# Patient Record
Sex: Male | Born: 1970 | Race: Black or African American | Hispanic: No | Marital: Single | Smoking: Former smoker
Health system: Southern US, Community
[De-identification: ages and names within clinical notes are randomized; demographics above are authoritative.]

## PROBLEM LIST (undated history)

## (undated) DIAGNOSIS — M545 Low back pain, unspecified: Secondary | ICD-10-CM

## (undated) DIAGNOSIS — A64 Unspecified sexually transmitted disease: Secondary | ICD-10-CM

## (undated) DIAGNOSIS — N489 Disorder of penis, unspecified: Secondary | ICD-10-CM

## (undated) DIAGNOSIS — N529 Male erectile dysfunction, unspecified: Secondary | ICD-10-CM

## (undated) DIAGNOSIS — S21239A Puncture wound without foreign body of unspecified back wall of thorax without penetration into thoracic cavity, initial encounter: Secondary | ICD-10-CM

## (undated) DIAGNOSIS — W3400XA Accidental discharge from unspecified firearms or gun, initial encounter: Secondary | ICD-10-CM

---

## 2011-06-25 ENCOUNTER — Ambulatory Visit: Payer: Self-pay

## 2011-06-25 LAB — URINALYSIS, COMPLETE
Bacteria: NEGATIVE
Bilirubin,UR: NEGATIVE
Glucose,UR: NEGATIVE mg/dL (ref 0–75)
Ketone: NEGATIVE
Nitrite: NEGATIVE
Specific Gravity: >=1.03 (ref 1.003–1.030)
Squamous Epithelial: NONE SEEN

## 2011-06-25 LAB — DOT URINE DIP: Glucose,UR: NEGATIVE mg/dL (ref 0–75)

## 2011-06-26 LAB — URINE CULTURE

## 2013-09-06 ENCOUNTER — Ambulatory Visit: Payer: Self-pay | Admitting: Internal Medicine

## 2013-09-06 LAB — DOT URINE DIP
BLOOD: NEGATIVE
GLUCOSE, UR: NEGATIVE mg/dL (ref 0–75)
PROTEIN: NEGATIVE
Specific Gravity: 1.015 (ref 1.003–1.030)

## 2014-09-11 DIAGNOSIS — M545 Low back pain, unspecified: Secondary | ICD-10-CM | POA: Insufficient documentation

## 2014-09-11 DIAGNOSIS — N529 Male erectile dysfunction, unspecified: Secondary | ICD-10-CM | POA: Insufficient documentation

## 2014-09-11 DIAGNOSIS — W3301XA Accidental discharge of shotgun, initial encounter: Secondary | ICD-10-CM | POA: Insufficient documentation

## 2015-10-09 ENCOUNTER — Ambulatory Visit
Admission: EM | Admit: 2015-10-09 | Discharge: 2015-10-09 | Disposition: A | Discharge status: Home / Self Care | Payer: Self-pay | Attending: Family Medicine | Admitting: Family Medicine

## 2015-10-09 DIAGNOSIS — Z029 Encounter for administrative examinations, unspecified: Secondary | ICD-10-CM

## 2015-10-09 DIAGNOSIS — Z024 Encounter for examination for driving license: Secondary | ICD-10-CM

## 2015-10-09 LAB — DEPT OF TRANSP DIPSTICK, URINE (ARMC ONLY)
GLUCOSE, UA: NEGATIVE mg/dL
Hgb urine dipstick: NEGATIVE
Protein, ur: NEGATIVE mg/dL
Specific Gravity, Urine: 1.02 (ref 1.005–1.030)

## 2015-10-09 NOTE — Discharge Instructions (Signed)
Encouraged to quit smoking. Follow-up in 2 years for next exam.

## 2015-10-09 NOTE — ED Triage Notes (Signed)
DOT Physical

## 2015-10-09 NOTE — ED Provider Notes (Signed)
CSN: [id number]     Arrival date & time 10/09/15  8657 History   First MD Initiated Contact with Patient 10/09/15 [phone]     Chief Complaint  Patient presents with  . Commercial Driver's License Exam   (Consider location/radiation/quality/duration/timing/severity/associated sxs/prior Treatment) Patient here for DOT exam. Previous exam 2 years ago. No chronic health issues. Takes no daily medication. Does admit to smoking cigars 3-4 per day for the past 20 years. No other concerns.    The history is provided by the patient.    History reviewed. No pertinent past medical history. History reviewed. No pertinent surgical history. Family History  Problem Relation Age of Onset  . Heart failure Father    Social History  Substance Use Topics  . Smoking status: Current Every Day Smoker    Types: Cigars  . Smokeless tobacco: Never Used  . Alcohol use Yes    Review of Systems  Constitutional: Negative.   HENT: Negative.   Eyes: Negative.   Respiratory: Negative.   Cardiovascular: Negative.   Gastrointestinal: Negative.   Endocrine: Negative.   Genitourinary: Negative.   Musculoskeletal: Negative.   Skin: Negative.   Allergic/Immunologic: Negative.   Neurological: Negative.   Hematological: Negative.   Psychiatric/Behavioral: Negative.     Allergies  Review of patient's allergies indicates no known allergies.  Home Medications   Prior to Admission medications   Not on File   Meds Ordered and Administered this Visit  Medications - No data to display  BP 119/83 (BP Location: Right Arm)   Pulse 75   Temp 97.9 F (36.6 C) (Tympanic)   Resp 18   Ht  (1.753 m)   Wt 211 lb (95.7 kg)   SpO2 100%   BMI 31.16 kg/m  No data found.   Physical Exam  Constitutional: He is oriented to person, place, and time. He appears well-developed and well-nourished.  HENT:  Head: Normocephalic and atraumatic.  Right Ear: Hearing, tympanic membrane, external ear and ear canal  normal.  Left Ear: Hearing, tympanic membrane, external ear and ear canal normal.  Nose: Nose normal.  Mouth/Throat: Uvula is midline, oropharynx is clear and moist and mucous membranes are normal.  Eyes: Conjunctivae, EOM and lids are normal. Pupils are equal, round, and reactive to light.  Neck: Trachea normal, normal range of motion, full passive range of motion without pain and phonation normal. Neck supple. Carotid bruit is not present. No thyroid mass and no thyromegaly present.  Cardiovascular: Normal rate, regular rhythm, normal heart sounds, intact distal pulses and normal pulses.   Pulmonary/Chest: Effort normal and breath sounds normal. No respiratory distress. He has no wheezes.  Abdominal: Soft. Bowel sounds are normal. He exhibits no distension and no mass. There is no tenderness. There is no guarding. Hernia confirmed negative in the right inguinal area and confirmed negative in the left inguinal area.  Musculoskeletal: Normal range of motion.  Neurological: He is alert and oriented to person, place, and time. He has normal reflexes.  Skin: Skin is warm and dry. Capillary refill takes less than 2 seconds.  Psychiatric: He has a normal mood and affect. His behavior is normal. Judgment and thought content normal.    Urgent Care Course   Clinical Course    Procedures (including critical care time)  Labs Review Labs Reviewed  DEPT OF TRANSP DIPSTICK, URINE([facility] ONLY)    Imaging Review No results found.   Visual Acuity Review  Right Eye Distance: 20/15 Left Eye Distance: 20/25 Bilateral  Distance: 20/20  Right Eye Near:   Left Eye Near:    Bilateral Near:         MDM   1. Encounter for Department of Transportation (DOT) examination for driving license renewal    See copy of DOT physical exam and paperwork for additional details. Urinalysis was normal.   Patient certified for 2 years. Encouraged patient to stop smoking and increase exercise.  Recommend follow-up in 2 years for re-certification.     Sudie GrumblingAnn Berry Krystyna Cleckley, NP 10/09/15 [phone]

## 2015-11-07 ENCOUNTER — Encounter: Payer: Self-pay | Admitting: *Deleted

## 2015-11-07 ENCOUNTER — Ambulatory Visit
Admission: EM | Admit: 2015-11-07 | Discharge: 2015-11-07 | Disposition: A | Discharge status: Home / Self Care | Payer: Worker's Compensation | Attending: Family Medicine | Admitting: Family Medicine

## 2015-11-07 ENCOUNTER — Ambulatory Visit (INDEPENDENT_AMBULATORY_CARE_PROVIDER_SITE_OTHER): Payer: Worker's Compensation

## 2015-11-07 DIAGNOSIS — S9032XA Contusion of left foot, initial encounter: Secondary | ICD-10-CM | POA: Diagnosis not present

## 2015-11-07 NOTE — ED Notes (Signed)
Patient refused post op shoe

## 2015-11-07 NOTE — ED Provider Notes (Signed)
[facility] ____________________________________________  Time seen: Approximately 5:10 PM  I have reviewed the triage vital signs and the nursing notes.   HISTORY  Chief Complaint Foot Injury  HPI Karie GeorgesKevin L Ciaramitaro is a 45 y.o. male presents for the complaints of left foot pain. Patient reports that today at work at approximately 10 AM, he and another coworker moving a heavy cart of drywall. Patient reports they were having to push and pull the cart to get over some electrical cords. Patient reports this process the cart then ran over his lower left foot. Patient reports that he has had pain to his left foot since the incident. Patient reports he has continued to work throughout the day. Patient reports that this time the pain is mild. Patient reports the pain increases with walking and weightbearing. Denies any pain radiation. Denies any numbness or tingling sensation.  Denies fall to the ground. Denies head injury or loss consciousness. Denies any other extremity pain, neck pain, back pain or injury. Denies any other complaints at this time. Reports has not taken any medications today for the same complaint. Denies any medical history. Denies taking any medications on a daily basis.   History reviewed. No pertinent past medical history.  There are no active problems to display for this patient.   History reviewed. No pertinent surgical history.    No current facility-administered medications for this encounter.  No current outpatient prescriptions on file.  Allergies Review of patient's allergies indicates no known allergies.  Family History  Problem Relation Age of Onset  . Heart failure Father     Social History Social History  Substance Use Topics  . Smoking status: Current Every Day Smoker    Types: Cigars  . Smokeless tobacco: Never Used  . Alcohol use Yes    Review of Systems Constitutional: No fever/chills Eyes: No visual changes. ENT: No sore  throat. Cardiovascular: Denies chest pain. Respiratory: Denies shortness of breath. Gastrointestinal: No abdominal pain.  No nausea, no vomiting.  No diarrhea.  No constipation. Genitourinary: Negative for dysuria. Musculoskeletal: Negative for back pain. Skin: Negative for rash. Neurological: Negative for headaches, focal weakness or numbness.  10-point ROS otherwise negative.  ____________________________________________   PHYSICAL EXAM:  VITAL SIGNS: ED Triage Vitals  Enc Vitals Group     BP      Pulse      Resp      Temp      Temp src      SpO2      Weight      Height      Head Circumference      Peak Flow      Pain Score      Pain Loc      Pain Edu?      Excl. in GC?    Today's Vitals   11/07/15 1707 11/07/15 1710 11/07/15 1715 11/07/15 1838  BP: 133/84     Pulse: 83     Resp: 16     Temp: 98.3 F (36.8 C)     TempSrc: Oral     SpO2: 99%     Weight:  207 lb (93.9 kg)    Height:  5\' 10"  (1.778 m)    PainSc:   6  5     Constitutional: Alert and oriented. Well appearing and in no acute distress. Eyes: Conjunctivae are normal. PERRL. EOMI. ENT      Head: Normocephalic and atraumatic.      Mouth/Throat: Mucous  membranes are moist Cardiovascular: Normal rate, regular rhythm. Grossly normal heart sounds.  Good peripheral circulation. Respiratory: Normal respiratory effort without tachypnea nor retractions. Breath sounds are clear and equal bilaterally. No wheezes/rales/rhonchi.. Gastrointestinal: Soft and nontender. Musculoskeletal:  Ambulatory with steady gait.   except: Left dorsal foot at distal second through fifth metatarsal mild swelling with mild to moderate tenderness to direct palpation, mild ecchymosis to left lateral fifth metatarsal, full range of motion, mild pain at distal fifth metatarsal with dorsiflexion, no pain with plantar flexion, no ankle tenderness, toes nontender, normal distal capillary refill throughout the left foot, no motor or tendon  deficits. Left lower extremity otherwise nontender. Bilateral pedal pulses equal and easily palpated. Neurologic:  Normal speech and language. No gross focal neurologic deficits are appreciated. Speech is normal. No gait instability.  Skin:  Skin is warm, dry and intact. No rash noted. Psychiatric: Mood and affect are normal. Speech and behavior are normal. Patient exhibits appropriate insight and judgment   ___________________________________________   LABS (all labs ordered are listed, but only abnormal results are displayed)  Labs Reviewed - No data to display  RADIOLOGY  Dg Foot Complete Left  Result Date: 11/07/2015 CLINICAL DATA:  Heavy object mandible or foot this morning. Pain and swelling. EXAM: LEFT FOOT - COMPLETE 3+ VIEW COMPARISON:  None. FINDINGS: The joint spaces are maintained.  No acute fracture is identified. IMPRESSION: No acute fracture. Electronically Signed   By: Rudie Meyer M.D.   On: 11/07/2015 18:09   ____________________________________________   PROCEDURES Procedures   Patient refused.   INITIAL IMPRESSION / ASSESSMENT AND PLAN / ED COURSE  Pertinent labs & imaging results that were available during my care of the patient were reviewed by me and considered in my medical decision making (see chart for details).  Well-appearing patient. No acute distress. Presents for the complaints of left dorsal foot pain post mechanical injury today at work. Denies fall. Denies any other pain or injury. Reports has continued to ambulate. Will evaluate left foot x-ray.  Left foot x-ray per radiologist no acute fracture. Discussed and reviewed x-ray with patient. Patient declined crutches. Discussed with patient we'll place patient in postoperative shoe for support. Suspect contusion injury. Patient initially agreed and then declined post op shoe, per RN. Patient declined the need for prescription medication. Take over-the-counter Tylenol or improvement as needed. Encourage  rest, ice and support. Discussed with patient to follow-up with Tommie Maximiano Coss FNP as needed for continued pain.  Discussed follow up with Primary care physician this week. Discussed follow up and return parameters including no resolution or any worsening concerns. Patient verbalized understanding and agreed to plan.   ____________________________________________   FINAL CLINICAL IMPRESSION(S) / ED DIAGNOSES  Final diagnoses:  Contusion of left foot, initial encounter     There are no discharge medications for this patient.   Note: This dictation was prepared with Dragon dictation along with smaller phrase technology. Any transcriptional errors that result from this process are unintentional.    Clinical Course      Renford Dills, NP 11/07/15 1853    Renford Dills, NP 11/07/15 [phone]

## 2015-11-07 NOTE — Discharge Instructions (Signed)
Rest. Apply ice.   Follow up with Tommie Maximiano CossAnne Moore FNP this week as needed for continued pain.   Follow up with your primary care physician this week as needed. Return to Urgent care for new or worsening concerns.

## 2015-11-07 NOTE — ED Triage Notes (Signed)
1000+ lb cart ran over side of pt's left foot this morning approx  1000. Pt continued to work today with foot pain. No edema or deformity.

## 2016-01-30 ENCOUNTER — Ambulatory Visit: Payer: Worker's Compensation

## 2016-01-30 ENCOUNTER — Ambulatory Visit
Admission: EM | Admit: 2016-01-30 | Discharge: 2016-01-30 | Disposition: A | Discharge status: Home / Self Care | Payer: Worker's Compensation | Attending: Emergency Medicine | Admitting: Emergency Medicine

## 2016-01-30 DIAGNOSIS — S39012A Strain of muscle, fascia and tendon of lower back, initial encounter: Secondary | ICD-10-CM

## 2016-01-30 HISTORY — DX: Puncture wound without foreign body of unspecified back wall of thorax without penetration into thoracic cavity, initial encounter: S21.239A

## 2016-01-30 HISTORY — DX: Accidental discharge from unspecified firearms or gun, initial encounter: W34.00XA

## 2016-01-30 MED ORDER — TIZANIDINE HCL 4 MG PO CAPS: 4.0000 mg | ORAL_CAPSULE | Freq: Three times a day (TID) | ORAL | 0 refills | Status: DC

## 2016-01-30 MED ORDER — NAPROXEN 500 MG PO TABS: 500.0000 mg | ORAL_TABLET | Freq: Two times a day (BID) | ORAL | 0 refills | Status: DC

## 2016-01-30 MED ORDER — KETOROLAC TROMETHAMINE 60 MG/2ML IM SOLN: 60.0000 mg | Freq: Once | INTRAMUSCULAR | Status: DC

## 2016-01-30 NOTE — ED Triage Notes (Signed)
Worker's comp. Pt was carrying sheet rock yesterday at work and noticed his low back started hurting later in the day. Today pain is worse. Low back and into abdomen. Sharp pains. 7/10

## 2016-01-30 NOTE — ED Provider Notes (Signed)
CSN: [id number]     Arrival date & time 01/30/16  [id number] History   First MD Initiated Contact with Patient 01/30/16 [phone]     Chief Complaint  Patient presents with  . Back Pain   (Consider location/radiation/quality/duration/timing/severity/associated sxs/prior Treatment) HPI  45 year old male presents with  low back pain with radiation around to the front of his abdomen indicates the. Umbilical region after he unloaded approximately 85 sheets of half-inch drywall yesterday. He thinks that the first load which incorporated 2 pieces of dry wall together the holding injury. He stated took several hours to perform the task and at the end was having back pain. Then this morning when he awoke he was having more pain. He tried to take to 2 ibuprofen before bedtime but still awoke with back pain. Any motion seems to bother him. He denies any urinary incontinence. He is having no radicular symptoms in his lower extremities.       History reviewed. No pertinent past medical history. History reviewed. No pertinent surgical history. Family History  Problem Relation Age of Onset  . Heart failure Father    Social History  Substance Use Topics  . Smoking status: Current Every Day Smoker    Types: Cigars  . Smokeless tobacco: Never Used  . Alcohol use Yes     Comment: social    Review of Systems  Constitutional: Positive for activity change. Negative for appetite change, chills, fatigue and fever.  Musculoskeletal: Positive for back pain.  All other systems reviewed and are negative.   Allergies  Patient has no known allergies.  Home Medications   Prior to Admission medications   Medication Sig Start Date End Date Taking? Authorizing Provider  naproxen (NAPROSYN) 500 MG tablet Take 1 tablet (500 mg total) by mouth 2 (two) times daily with a meal. 01/30/16   Lutricia FeilWilliam P Hattie Pine, PA-C  tiZANidine (ZANAFLEX) 4 MG capsule Take 1 capsule (4 mg total) by mouth 3 (three) times daily. 01/30/16    Lutricia FeilWilliam P Ta Fair, PA-C   Meds Ordered and Administered this Visit  Medications - No data to display  BP 119/76 (BP Location: Left Arm)   Pulse 72   Temp 97.9 F (36.6 C) (Oral)   Resp 18   Ht 5' 9.5" (1.765 m)   Wt 214 lb (97.1 kg)   SpO2 99%   BMI 31.15 kg/m  No data found.   Physical Exam  Constitutional: He is oriented to person, place, and time. He appears well-developed and well-nourished. No distress.  HENT:  Head: Normocephalic and atraumatic.  Eyes: EOM are normal. Pupils are equal, round, and reactive to light.  Neck: Normal range of motion. Neck supple.  Musculoskeletal: He exhibits tenderness.  Examination of the lumbar spine shows a level pelvis in stance. he has a slight forward list in stance. Has very minimal range of motion of the foot with flexion and lateral flexion bilaterally complaining of pain in the lower lumbar segments. Is able to toe and heel walk adequately. EHL peroneal and anterior tibialis muscles are strong to clinical testing. DTRs are 2+ over 4 and symmetrical in the knee and ankle jerk. Straight leg raise testing is 90 in the sitting position with no discomfort. There is no discomfort to palpation of the lower paraspinous muscles.  Neurological: He is alert and oriented to person, place, and time.  Skin: Skin is warm and dry. He is not diaphoretic.  Psychiatric: He has a normal mood and affect. His behavior is normal.  Judgment and thought content normal.  Nursing note and vitals reviewed.   Urgent Care Course   Clinical Course     Procedures (including critical care time)  Labs Review Labs Reviewed - No data to display  Imaging Review No results found.   Visual Acuity Review  Right Eye Distance:   Left Eye Distance:   Bilateral Distance:    Right Eye Near:   Left Eye Near:    Bilateral Near:     The patient refused an injection of Toradol for pain and also refused x-rays.    MDM   1. Strain of lumbar region, initial  encounter    Discharge Medication List as of 01/30/2016 10:31 AM    START taking these medications   Details  naproxen (NAPROSYN) 500 MG tablet Take 1 tablet (500 mg total) by mouth 2 (two) times daily with a meal., Starting Thu 01/30/2016, Normal    tiZANidine (ZANAFLEX) 4 MG capsule Take 1 capsule (4 mg total) by mouth 3 (three) times daily., Starting Thu 01/30/2016, Normal      Plan: 1. Test/x-ray results and diagnosis reviewed with patient 2. rx as per orders; risks, benefits, potential side effects reviewed with patient 3. Recommend supportive treatment with symptom avoidance. Give him some restrictions for the next week. He will follow-up with Dr. Glenard Haringommy Moore FNP [facility] week. 4. F/u prn if symptoms worsen or don't improve     Lutricia FeilWilliam P Cari Burgo, PA-C 01/30/16 1048

## 2016-02-06 ENCOUNTER — Ambulatory Visit
Admission: RE | Admit: 2016-02-06 | Discharge: 2016-02-06 | Disposition: A | Discharge status: Home / Self Care | Payer: Worker's Compensation | Source: Ambulatory Visit | Attending: Family | Admitting: Family

## 2016-02-06 ENCOUNTER — Other Ambulatory Visit: Payer: Self-pay | Admitting: Family

## 2016-02-06 ENCOUNTER — Ambulatory Visit
Admission: AD | Admit: 2016-02-06 | Discharge: 2016-02-06 | Disposition: A | Discharge status: Home / Self Care | Payer: Worker's Compensation | Source: Ambulatory Visit | Attending: Family | Admitting: Family

## 2016-02-06 DIAGNOSIS — R52 Pain, unspecified: Secondary | ICD-10-CM

## 2016-02-06 DIAGNOSIS — M5136 Other intervertebral disc degeneration, lumbar region: Secondary | ICD-10-CM | POA: Insufficient documentation

## 2016-02-06 DIAGNOSIS — M545 Low back pain: Secondary | ICD-10-CM | POA: Diagnosis not present

## 2016-02-06 DIAGNOSIS — T148XXA Other injury of unspecified body region, initial encounter: Secondary | ICD-10-CM

## 2017-06-06 IMAGING — CR DG FOOT COMPLETE 3+V*L*
3 series · 3 of 3 positions shown · non-contrast
Comparison: None.

CLINICAL DATA: Heavy object mandible or foot this morning. Pain and
swelling.

EXAM:
LEFT FOOT - COMPLETE 3+ VIEW

[foot ap]
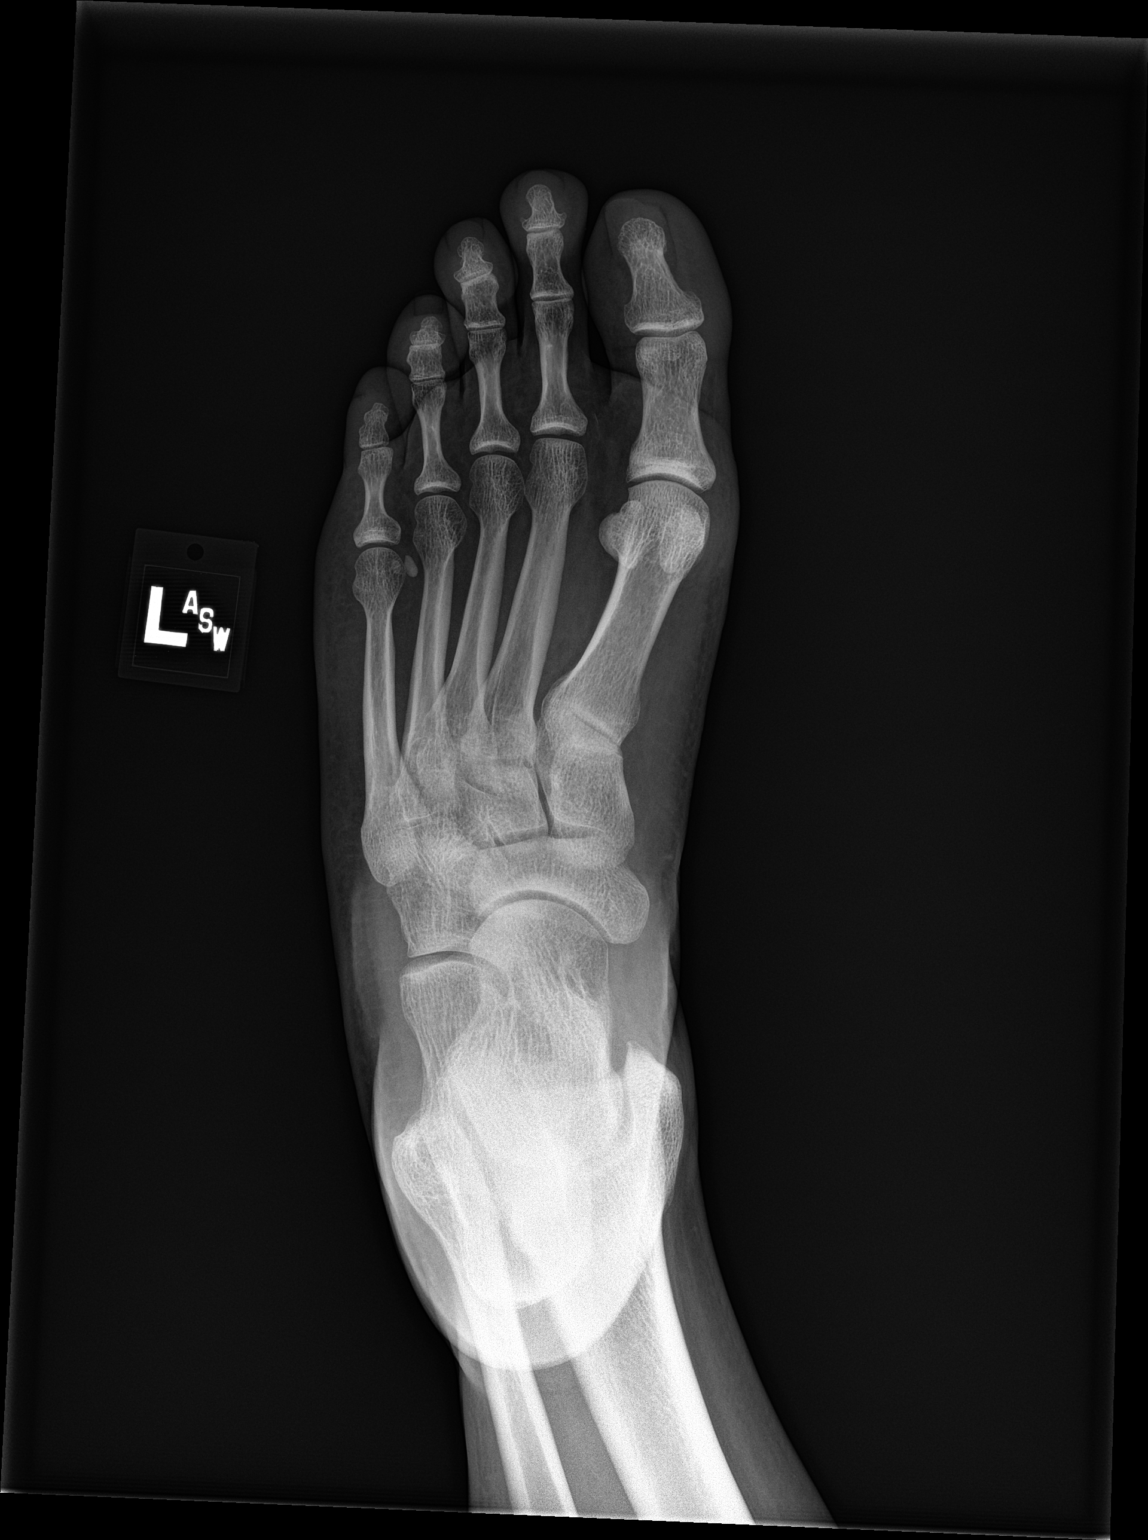

[foot obl]
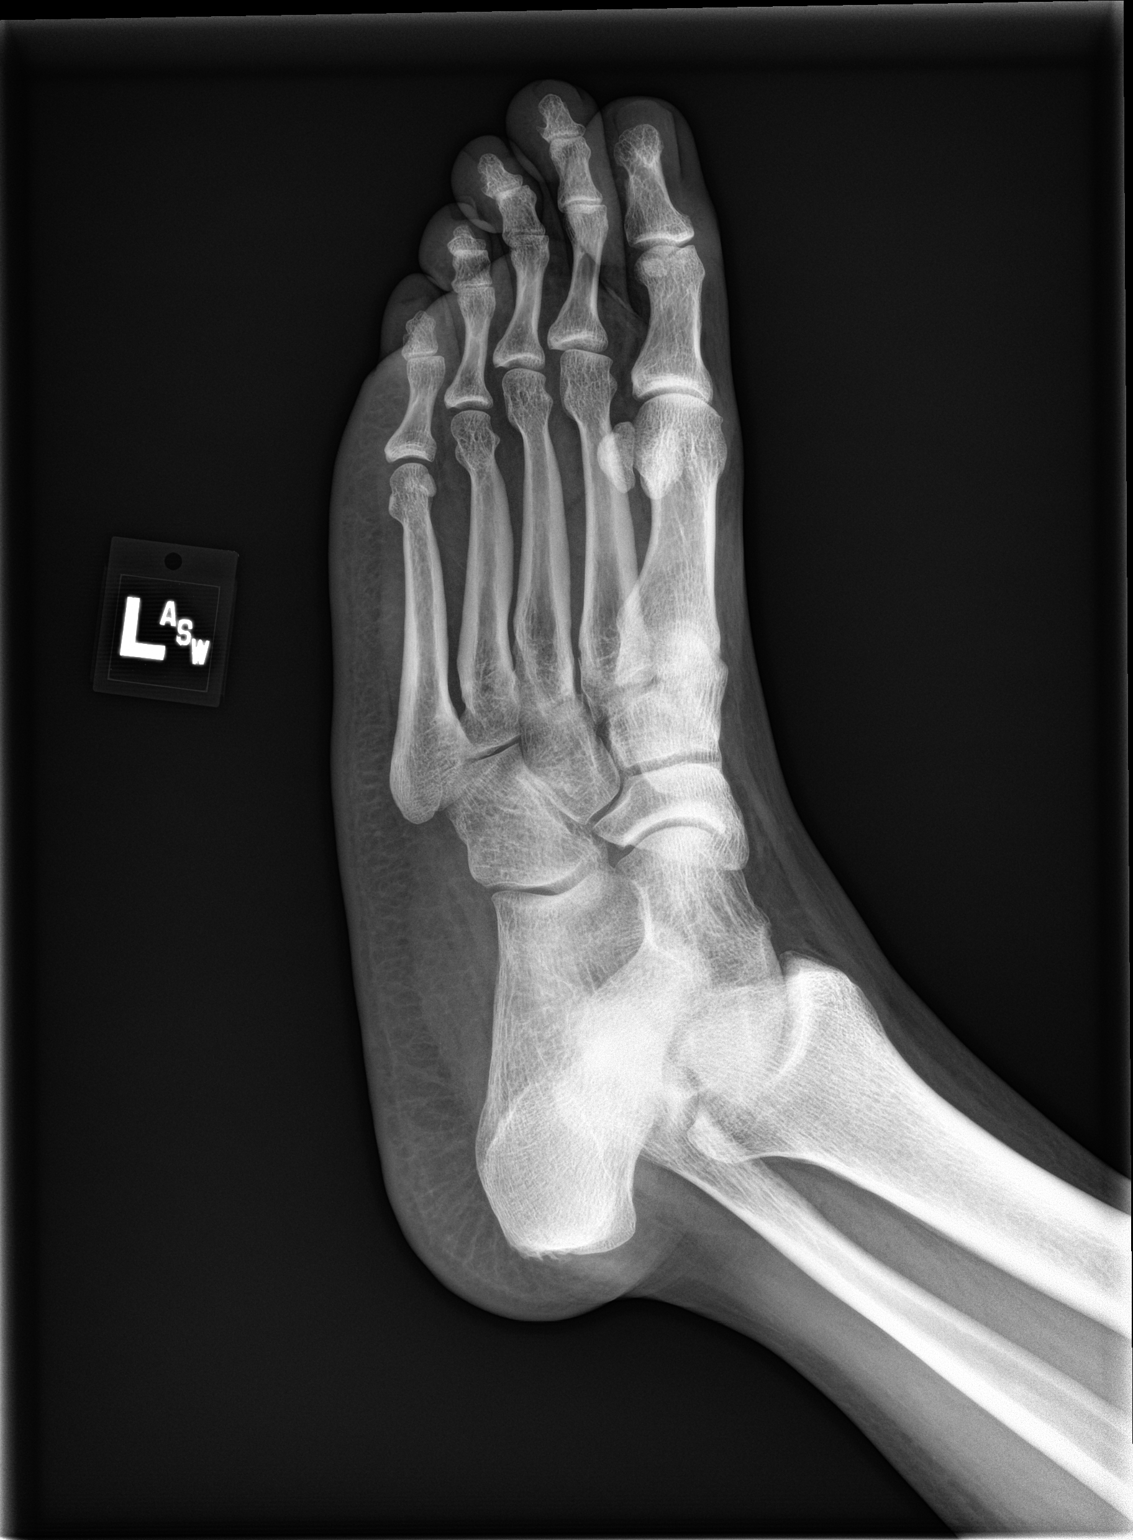

[foot lat]
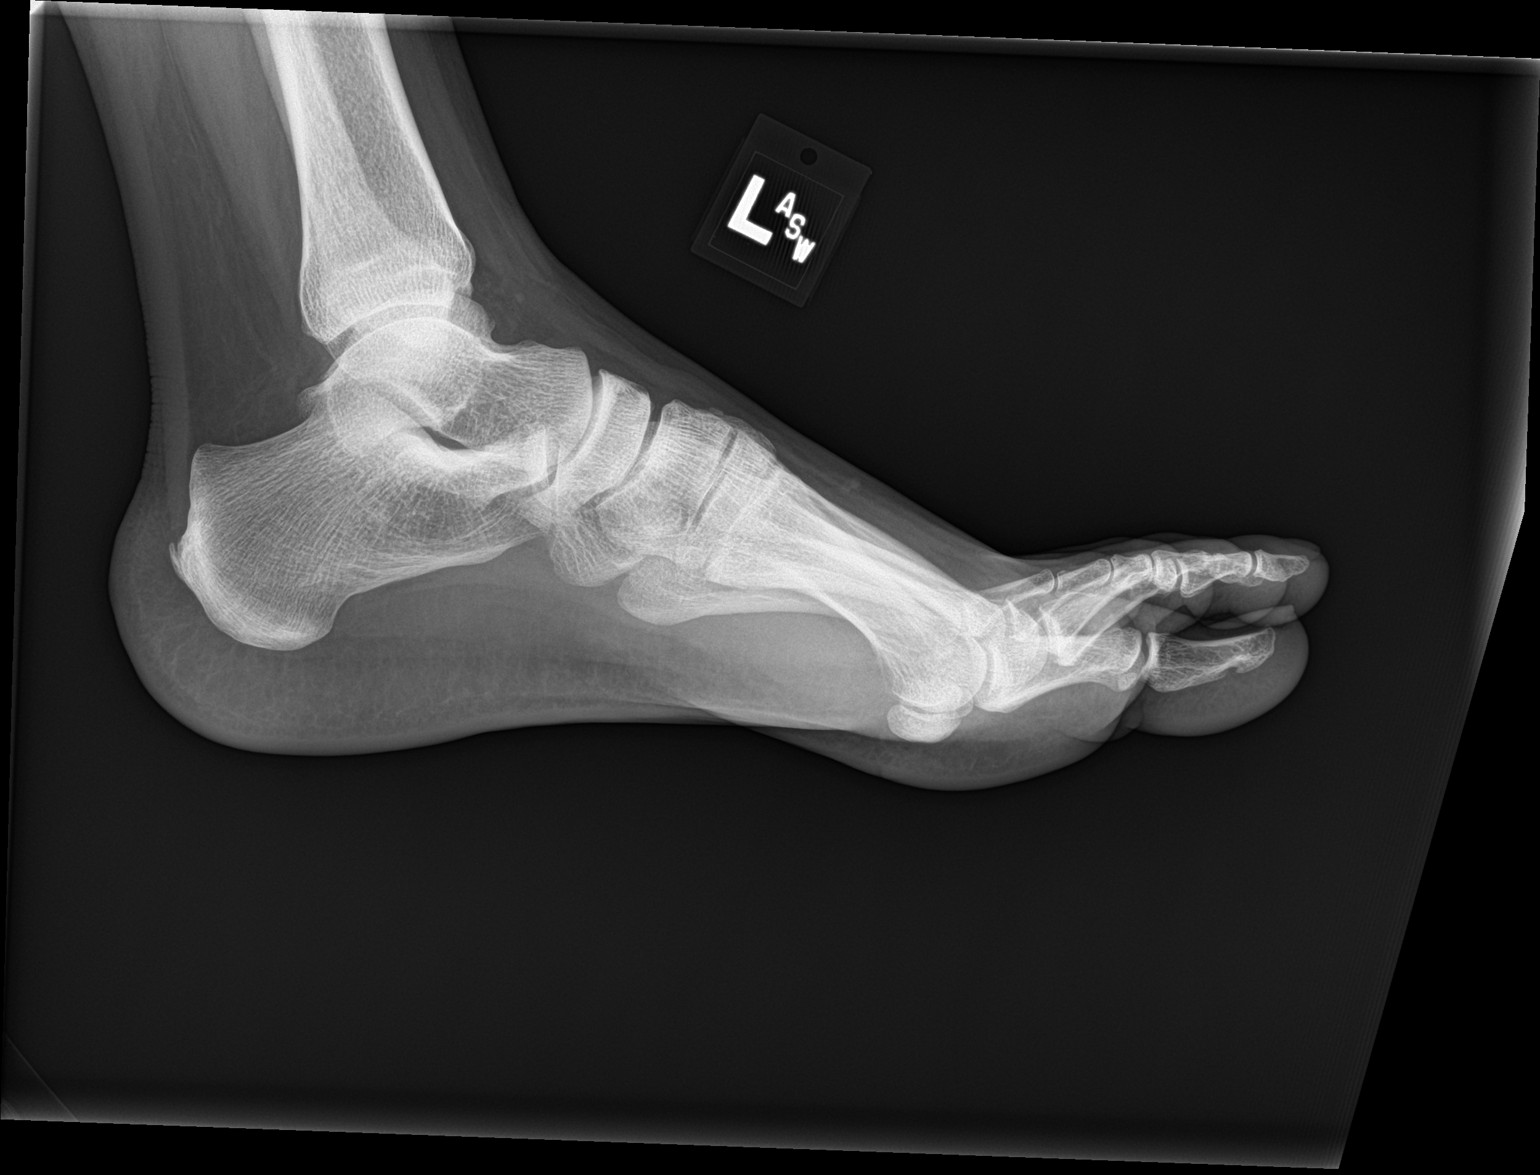

[3 of 3 positions shown; findings below may reference images not displayed]

FINDINGS: The joint spaces are maintained.  No acute fracture is identified.
IMPRESSION: No acute fracture.

## 2017-09-05 IMAGING — CR DG LUMBAR SPINE COMPLETE 4+V
1 series · 5 of 5 positions shown · non-contrast
Comparison: None.

CLINICAL DATA: Acute lower back pain after injury at work.

EXAM:
LUMBAR SPINE - COMPLETE 4+ VIEW

[Series 1: dg lumbar spine complete 4 +v · 0.14mm/px · 5 of 5 slices shown]
[im 1/5]
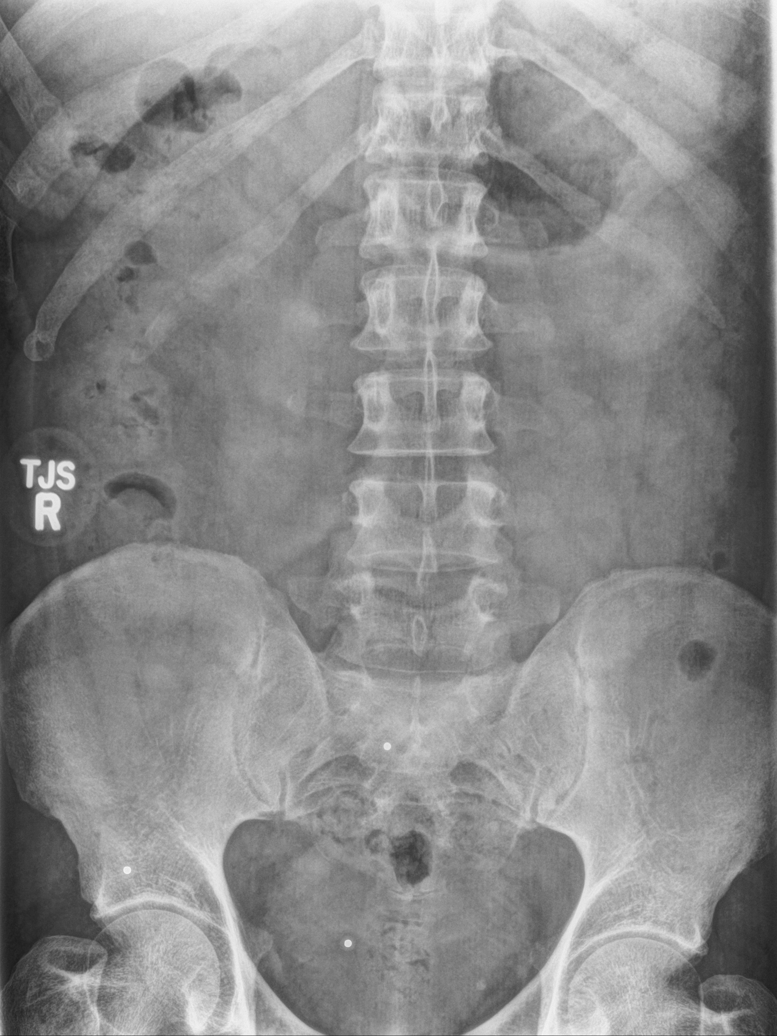
[im 2/5]
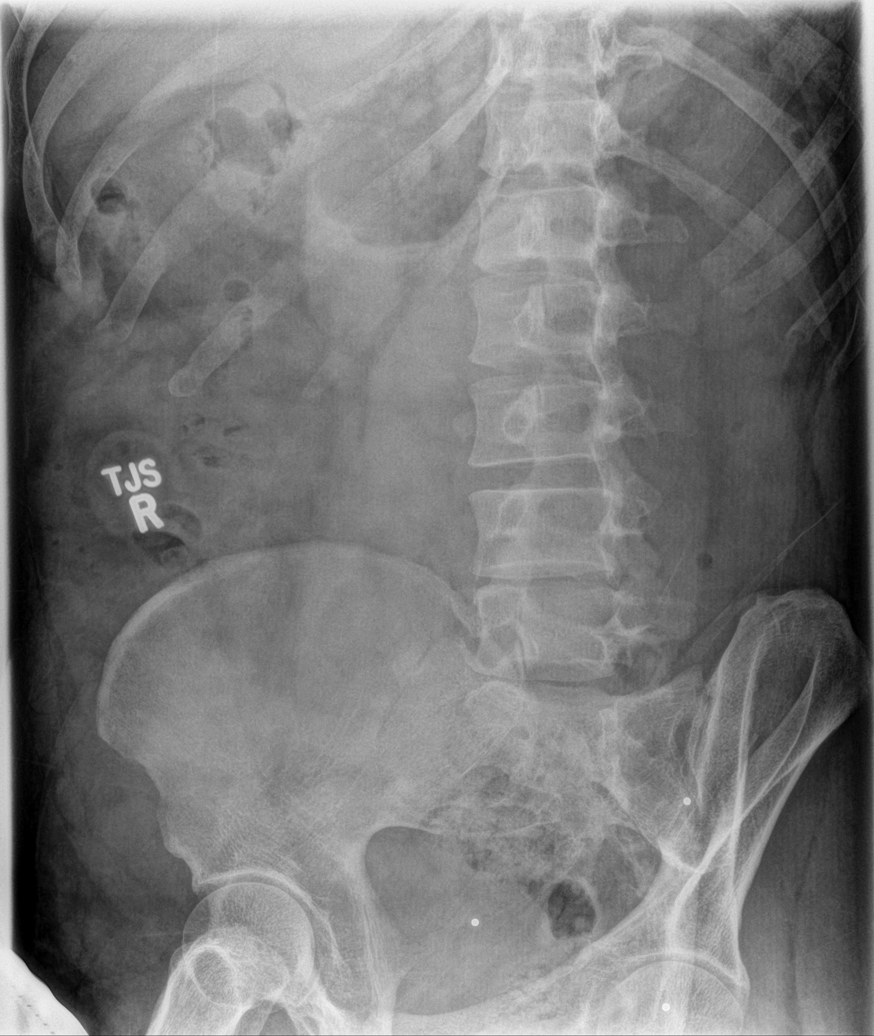
[im 3/5]
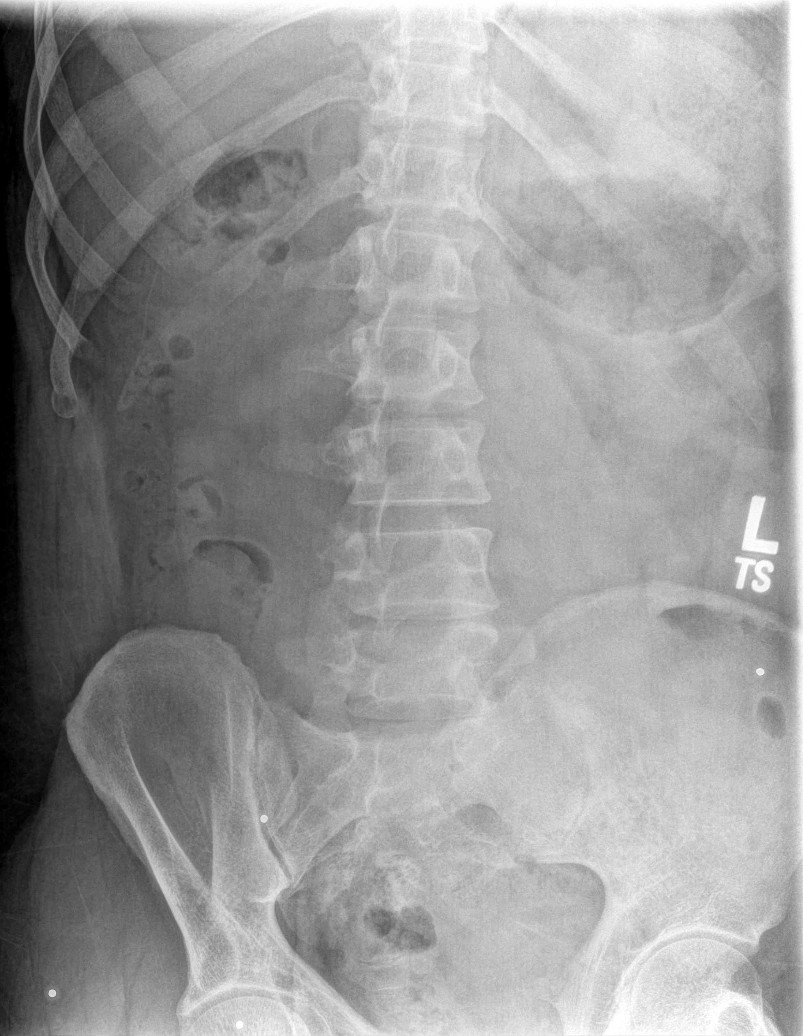
[im 4/5]
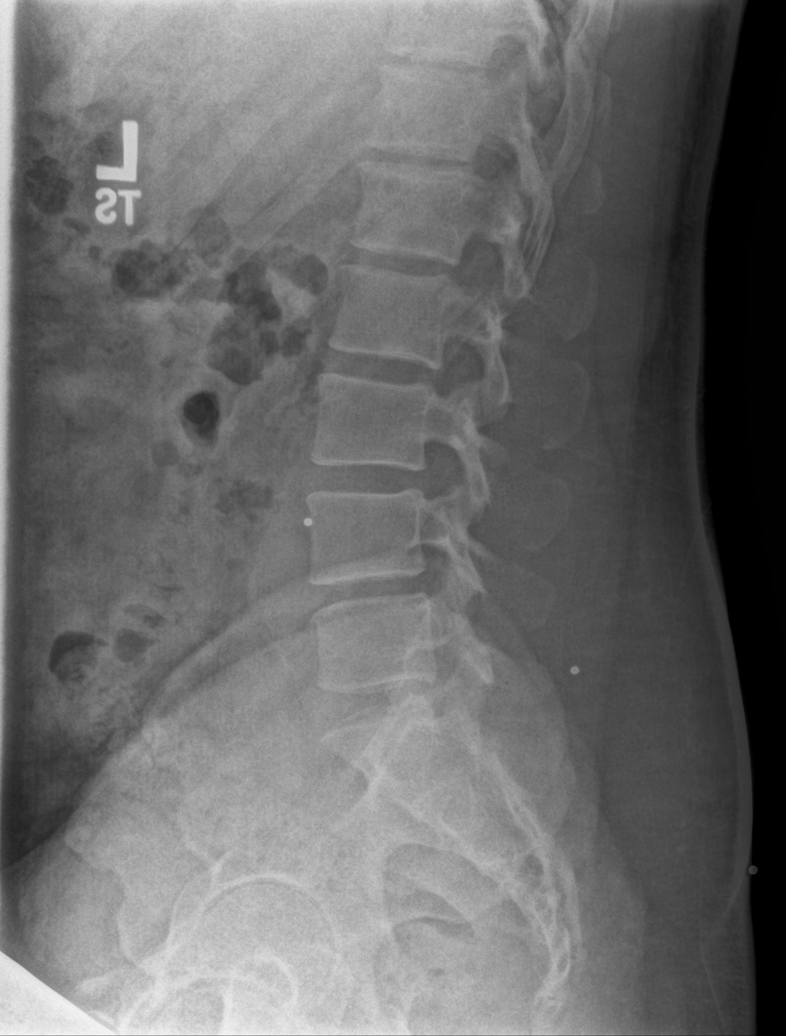
[im 5/5]
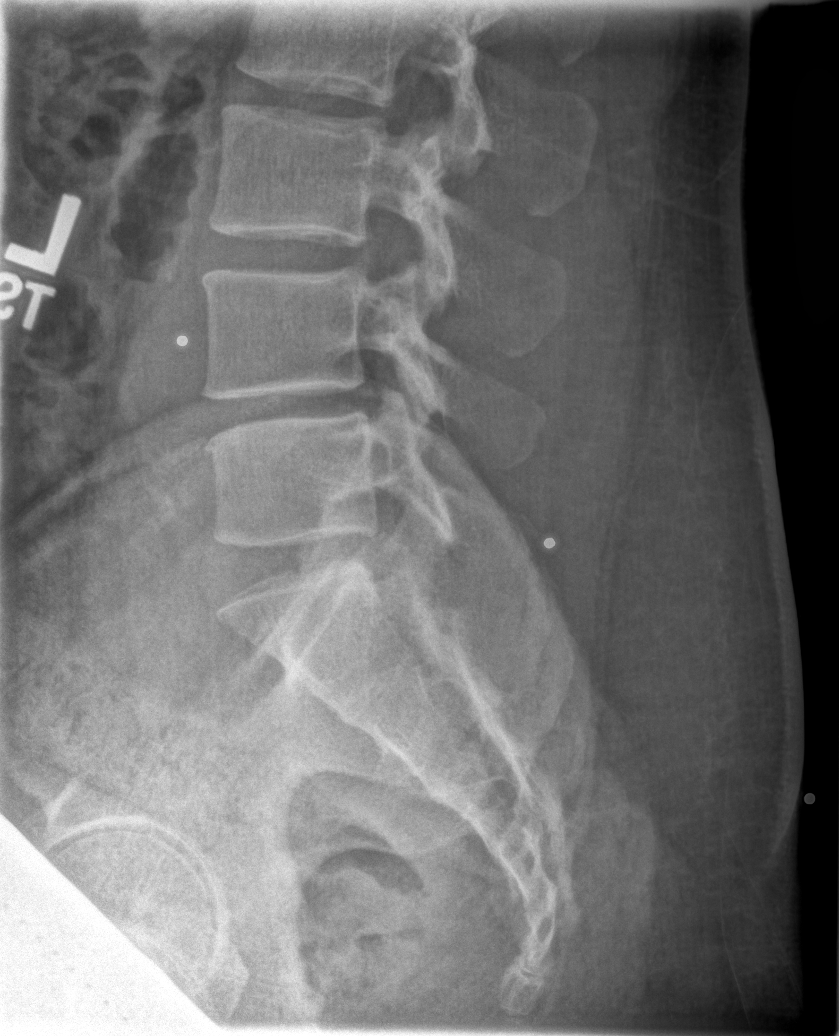

[5 of 5 positions shown; findings below may reference images not displayed]

FINDINGS: No fracture or spondylolisthesis is noted. Mild degenerative disc
disease is noted at L4-5. Posterior facet joints are unremarkable.
Remaining disc spaces appear to be intact.
IMPRESSION: Mild degenerative disc disease is noted at L4-5. No acute
abnormality seen in the lumbar spine.

## 2021-01-28 DIAGNOSIS — N489 Disorder of penis, unspecified: Secondary | ICD-10-CM | POA: Insufficient documentation

## 2021-01-28 DIAGNOSIS — Z113 Encounter for screening for infections with a predominantly sexual mode of transmission: Secondary | ICD-10-CM | POA: Insufficient documentation

## 2022-07-23 ENCOUNTER — Ambulatory Visit
Admission: EM | Admit: 2022-07-23 | Discharge: 2022-07-23 | Disposition: A | Discharge status: Home / Self Care | Payer: BC Managed Care – PPO | Attending: Emergency Medicine | Admitting: Emergency Medicine

## 2022-07-23 DIAGNOSIS — Z720 Tobacco use: Secondary | ICD-10-CM | POA: Insufficient documentation

## 2022-07-23 DIAGNOSIS — M5442 Lumbago with sciatica, left side: Secondary | ICD-10-CM | POA: Diagnosis not present

## 2022-07-23 HISTORY — DX: Male erectile dysfunction, unspecified: N52.9

## 2022-07-23 HISTORY — DX: Low back pain, unspecified: M54.50

## 2022-07-23 HISTORY — DX: Disorder of penis, unspecified: N48.9

## 2022-07-23 HISTORY — DX: Unspecified sexually transmitted disease: A64

## 2022-07-23 MED ORDER — BACLOFEN 10 MG PO TABS: 10.0000 mg | ORAL_TABLET | Freq: Three times a day (TID) | ORAL | 0 refills | Status: DC

## 2022-07-23 MED ORDER — PREDNISONE 10 MG (21) PO TBPK: ORAL_TABLET | ORAL | 0 refills | Status: DC

## 2022-07-23 NOTE — Discharge Instructions (Addendum)
Take the prednisone according to the package instructions.  You will take it each morning with breakfast.  Make sure that you are always taking it with food.  Take the baclofen every 8 hours as needed for muscle spasm.  Follow the rehabilitation exercises in your discharge paperwork to help you with your sciatic pain.  Return for reevaluation for new or worsening symptoms, or see your primary care provider.  
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
## 2022-07-23 NOTE — ED Triage Notes (Signed)
Pt c/o L leg pain x14 days. States pain started in lower back which now radiates down to leg & makes his toes go numb. States he did lift up a push mower about 2 wks ago.

## 2023-07-01 ENCOUNTER — Ambulatory Visit
Admission: EM | Admit: 2023-07-01 | Discharge: 2023-07-01 | Disposition: A | Discharge status: Home / Self Care | Attending: Physician Assistant | Admitting: Physician Assistant

## 2023-07-01 DIAGNOSIS — M5412 Radiculopathy, cervical region: Secondary | ICD-10-CM

## 2023-07-01 DIAGNOSIS — M79602 Pain in left arm: Secondary | ICD-10-CM

## 2023-07-01 MED ORDER — HYDROCODONE-ACETAMINOPHEN 5-325 MG PO TABS: 1.0000 | ORAL_TABLET | Freq: Four times a day (QID) | ORAL | 0 refills | Status: AC | PRN

## 2023-07-01 MED ORDER — KETOROLAC TROMETHAMINE 30 MG/ML IJ SOLN
30.0000 mg | Freq: Once | INTRAMUSCULAR | Status: AC
  Administered 2023-07-01: 30 mg via INTRAMUSCULAR

## 2023-07-01 MED ORDER — PREDNISONE 10 MG PO TABS: ORAL_TABLET | ORAL | 0 refills | Status: AC

## 2023-07-01 MED ORDER — TIZANIDINE HCL 4 MG PO TABS: 4.0000 mg | ORAL_TABLET | Freq: Every evening | ORAL | 0 refills | Status: AC | PRN

## 2023-07-01 NOTE — ED Triage Notes (Addendum)
 Sharp forearm pain x 5 days, gets worse at night and in the morning.  No injury

## 2023-07-01 NOTE — Discharge Instructions (Signed)
 NECK PAIN: Stressed avoiding painful activities. This can exacerbate your symptoms and make them worse.  May apply heat to the areas of pain for some relief. Use medications as directed. Be aware of which medications make you drowsy and do not drive or operate any kind of heavy machinery while using the medication (ie pain medications or muscle relaxers). F/U with PCP for reexamination or return sooner if condition worsens or does not begin to improve over the next few days.   NECK PAIN RED FLAGS: If symptoms get worse than they are right now, you should come back sooner for re-evaluation. If you have increased numbness/ tingling or notice that the numbness/tingling is affecting the legs or saddle region, go to ER. If you ever lose continence go to ER.      You have a condition requiring you to follow up with Orthopedics so please call one of the following office for appointment:   [facility] Address: [address], [place], [place] [address] Phone: [phone]  [facility] [address] [place], [place] [address] Phone: [phone]  [facility] [address] [place], [place] [address] Phone: [phone]
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
## 2024-04-07 ENCOUNTER — Ambulatory Visit
Admission: EM | Admit: 2024-04-07 | Discharge: 2024-04-07 | Disposition: A | Discharge status: Home / Self Care | Source: Home / Self Care | Attending: Family Medicine | Admitting: Family Medicine

## 2024-04-07 DIAGNOSIS — Z202 Contact with and (suspected) exposure to infections with a predominantly sexual mode of transmission: Secondary | ICD-10-CM

## 2024-04-07 MED ORDER — METRONIDAZOLE 500 MG PO TABS: 500.0000 mg | ORAL_TABLET | Freq: Two times a day (BID) | ORAL | 0 refills | Status: AC

## 2024-04-07 NOTE — ED Triage Notes (Signed)
 fiance was dx with trichomonas, and was treated.  Would like to get tested and treated as well.  No issues, no discharge, no pain
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
## 2024-04-07 NOTE — Discharge Instructions (Addendum)
 You are being treated for trichomonas which is a sexually transmitted infection. Stop by the pharmacy to pick up your prescriptions.  Follow up with your primary care provider or return to the urgent care, if not improving.    Your STD test results will be available in the next 72 hours. If positive, someone will contact you.  You should see your results in your MyChart account.
